# Patient Record
Sex: Female | Born: 1970 | Race: White | Hispanic: No | Marital: Married | Smoking: Never smoker
Health system: Southern US, Community
[De-identification: ages and names within clinical notes are randomized; demographics above are authoritative.]

## PROBLEM LIST (undated history)

## (undated) DIAGNOSIS — Z8639 Personal history of other endocrine, nutritional and metabolic disease: Secondary | ICD-10-CM

## (undated) DIAGNOSIS — E669 Obesity, unspecified: Secondary | ICD-10-CM

## (undated) DIAGNOSIS — J219 Acute bronchiolitis, unspecified: Secondary | ICD-10-CM

## (undated) DIAGNOSIS — I10 Essential (primary) hypertension: Secondary | ICD-10-CM

## (undated) HISTORY — DX: Acute bronchiolitis, unspecified: J21.9

## (undated) HISTORY — PX: AUGMENTATION MAMMAPLASTY: SUR837

## (undated) HISTORY — PX: COMBINED AUGMENTATION MAMMAPLASTY AND ABDOMINOPLASTY: SUR291

## (undated) HISTORY — DX: Obesity, unspecified: E66.9

## (undated) HISTORY — DX: Essential (primary) hypertension: I10

## (undated) HISTORY — DX: Personal history of other endocrine, nutritional and metabolic disease: Z86.39

---

## 2011-08-21 DIAGNOSIS — Z8639 Personal history of other endocrine, nutritional and metabolic disease: Secondary | ICD-10-CM

## 2011-08-21 HISTORY — DX: Personal history of other endocrine, nutritional and metabolic disease: Z86.39

## 2016-11-19 ENCOUNTER — Encounter: Payer: Self-pay | Admitting: Interventional Cardiology

## 2016-12-04 ENCOUNTER — Encounter (INDEPENDENT_AMBULATORY_CARE_PROVIDER_SITE_OTHER): Payer: Self-pay

## 2016-12-04 ENCOUNTER — Ambulatory Visit (INDEPENDENT_AMBULATORY_CARE_PROVIDER_SITE_OTHER): Payer: 59 | Admitting: Interventional Cardiology

## 2016-12-04 ENCOUNTER — Encounter: Payer: Self-pay | Admitting: Interventional Cardiology

## 2016-12-04 VITALS — BP 120/90 | HR 100 | Ht 63.75 in | Wt 201.0 lb

## 2016-12-04 DIAGNOSIS — M7989 Other specified soft tissue disorders: Secondary | ICD-10-CM

## 2016-12-04 DIAGNOSIS — I10 Essential (primary) hypertension: Secondary | ICD-10-CM | POA: Diagnosis not present

## 2016-12-04 DIAGNOSIS — R002 Palpitations: Secondary | ICD-10-CM

## 2016-12-04 MED ORDER — LOSARTAN POTASSIUM 25 MG PO TABS: 25.0000 mg | ORAL_TABLET | Freq: Every day | ORAL | 3 refills | Status: DC

## 2016-12-04 MED ORDER — LOSARTAN POTASSIUM 25 MG PO TABS: 25.0000 mg | ORAL_TABLET | Freq: Every day | ORAL | 3 refills | Status: AC

## 2016-12-04 MED ORDER — HYDROCHLOROTHIAZIDE 12.5 MG PO CAPS: 12.5000 mg | ORAL_CAPSULE | Freq: Every day | ORAL | 3 refills | Status: DC

## 2016-12-04 NOTE — Progress Notes (Signed)
Cardiology Office Note   Date:  12/04/2016   ID:  Angela Kemp, DOB 03-May-1971, MRN [id number]  PCP:  WHITE, MARSHA L, NP    No chief complaint on file. HTN   Wt Readings from Last 3 Encounters:  12/04/16 201 lb (91.2 kg)       History of Present Illness: Angela Kemp is a 46 y.o. female who is being seen today for the evaluation of hypertension, palpitations at the request of Kathlen Brunswick, NP.  She has been on meds for a year and her BP is controlled.  She has a cough since starting this medicine.  It is a dry annoying cough.  She has had some palpitations over the past 6 months.  She thinks it is anxiety.  It may be in the middle of the night.  Resolves with a few deep breaths.  Nothing more sustained.  No lightheadedness with that.  She has these episodes 2-3x/week.  It can start during the day as well.    Her father has AFib.  Her Mom has mitral valve prolapse.    She had a heart murmur as a child, but no echo was done that she remembers.    She walks 3x/week.  Occasional DOE.  Up until 3 months ago, she was running without any SHOB. She is planning on going back to running for exercise.  No chest pain  She is originally from [place].    She has been taking phentermine for weight loss.      Past Medical History:  Diagnosis Date  . Acute bronchiolitis   . History of early menarche 2013  . Hypertension   . Obesity     Past Surgical History:  Procedure Laterality Date  . COMBINED AUGMENTATION MAMMAPLASTY AND ABDOMINOPLASTY       Current Outpatient Prescriptions  Medication Sig Dispense Refill  . lisinopril-hydrochlorothiazide (PRINZIDE,ZESTORETIC) 10-12.5 MG tablet Take 1 tablet by mouth 2 (two) times daily.    . phentermine 15 MG capsule Take 15 mg by mouth daily.  0   No current facility-administered medications for this visit.     Allergies:   Penicillins    Social History:  The patient  reports that she has never smoked. She has never used  smokeless tobacco. She reports that she drinks alcohol. She reports that she does not use drugs.   Family History:  The patient's family history includes Breast cancer in her mother; Hypertension in her father.    ROS:  Please see the history of present illness.   Otherwise, review of systems are positive for palpitations.   All other systems are reviewed and negative.    PHYSICAL EXAM: VS:  BP 120/90   Pulse 100   Ht 5' 3.75" (1.619 m)   Wt 201 lb (91.2 kg)   BMI 34.77 kg/m  , BMI Body mass index is 34.77 kg/m. GEN: Well nourished, well developed, in no acute distress  HEENT: normal  Neck: no JVD, carotid bruits, or masses Cardiac: RRR; no murmurs, rubs, or gallops,no edema  Respiratory:  clear to auscultation bilaterally, normal work of breathing GI: soft, nontender, nondistended, + BS MS: no deformity or atrophy  Skin: warm and dry, no rash Neuro:  Strength and sensation are intact Psych: euthymic mood, full affect   EKG:   The ekg ordered 09/25/16 demonstrates NSR, no significant ST changes   Recent Labs: No results found for requested labs within last 8760 hours.   Lipid Panel No results  found for: CHOL, TRIG, HDL, CHOLHDL, VLDL, LDLCALC, LDLDIRECT   Other studies Reviewed: Additional studies/ records that were reviewed today with results demonstrating: prior ECG reviewed.   ASSESSMENT AND PLAN:  1. Palpitations: No high risk features. No lightheadedness or syncope. Normal physical exam. We'll plan for 30 day event monitor.  2. She reports left lower extremity swelling, worse than her right leg. She states there is a hard area in the upper portion of her calf at times. We'll plan for lower extremity venous Doppler to evaluate for DVT. Today on exam, there is no significant difference in size between her calves. 3. Hypertension: Due to her cough, we'll switch her lisinopril to losartan 25 mg daily. Continue HCTZ 12.5 mg daily. There is no combination pill in the  strength so we will call in 2 separate prescriptions. Cough should improve off of the ACE inhibitor.   Current medicines are reviewed at length with the patient today.  The patient concerns regarding her medicines were addressed.  The following changes have been made:  HTN med change  Labs/ tests ordered today include:  No orders of the defined types were placed in this encounter.   Recommend 150 minutes/week of aerobic exercise Low fat, low carb, high fiber diet recommended  Disposition:   FU in prn   Signed, Lance Muss, MD  12/04/2016 5:07 PM    [facility] [address] [place], [place], [place]  [address] Phone: [phone]; Fax: [phone]

## 2016-12-04 NOTE — Patient Instructions (Signed)
Medication Instructions:  STOP lisinopril-HCTZ  START losartan 25 mg daily  START HCTZ 12.5 mg daily  Labwork: None ordered  Testing/Procedures: Your physician has recommended that you wear an event monitor. Event monitors are medical devices that record the heart's electrical activity. Doctors most often [place] these monitors to diagnose arrhythmias. Arrhythmias are problems with the speed or rhythm of the heartbeat. The monitor is a small, portable device. You can wear one while you do your normal daily activities. This is usually used to diagnose what is causing palpitations/syncope (passing out).  Your physician has requested that you have a lower extremity venous duplex. This test is an ultrasound of the veins in the legs or arms. It looks at venous blood flow that carries blood from the heart to the legs or arms. Allow one hour for a Lower Venous exam. Allow thirty minutes for an Upper Venous exam. There are no restrictions or special instructions.     Follow-Up: To be determined based on results  Any Other Special Instructions Will Be Listed Below (If Applicable).     If you need a refill on your cardiac medications before your next appointment, please call your pharmacy.

## 2016-12-19 ENCOUNTER — Telehealth: Payer: Self-pay | Admitting: *Deleted

## 2016-12-19 MED ORDER — HYDROCHLOROTHIAZIDE 12.5 MG PO CAPS: 12.5000 mg | ORAL_CAPSULE | Freq: Two times a day (BID) | ORAL | 3 refills | Status: AC

## 2016-12-19 NOTE — Telephone Encounter (Signed)
OK to increase HCTZ to 25 mg daily and see if swelling gets better.

## 2016-12-19 NOTE — Telephone Encounter (Signed)
Called and made patient aware of Dr. Hoyle Barr recommendations. Patient requesting another Rx be sent to [facility] so that she does not run out. Prescription sent to patient's preferred pharmacy. Patient verbalized understanding and thanked me for the call.

## 2016-12-19 NOTE — Telephone Encounter (Signed)
Called and spoke with patient. Patient states that since she switched her medicine at her OV on 4/17 that she has had increased swelling in her hands and feet. Patient denies any swelling in her face and neck, SOB, weight gain, or any other symptoms. Patient was taking lisinopril-HCTZ 10-12.5 BID and was switched to take losartan 25 mg QD and HCTZ 12.5 QD. Patient states that her cough has improved since switching, but she would rather go back to the lisinopril-HCTZ if needed. Please advise.

## 2016-12-19 NOTE — Telephone Encounter (Signed)
Patient called with c/o swelling in her feet and hands. She feels that this is due to the recent change in meds at her last office visit. She states that the cough has subsided. Patient can be reached at [phone]. Please advise. Thanks, MI

## 2016-12-24 ENCOUNTER — Ambulatory Visit (HOSPITAL_COMMUNITY)
Admission: RE | Admit: 2016-12-24 | Discharge: 2016-12-24 | Disposition: A | Discharge status: Home / Self Care | Payer: 59 | Source: Ambulatory Visit | Attending: Cardiovascular Disease | Admitting: Cardiovascular Disease

## 2016-12-24 ENCOUNTER — Ambulatory Visit (INDEPENDENT_AMBULATORY_CARE_PROVIDER_SITE_OTHER): Payer: 59

## 2016-12-24 DIAGNOSIS — M7989 Other specified soft tissue disorders: Secondary | ICD-10-CM | POA: Insufficient documentation

## 2016-12-24 DIAGNOSIS — R002 Palpitations: Secondary | ICD-10-CM

## 2017-12-04 DIAGNOSIS — S61216A Laceration without foreign body of right little finger without damage to nail, initial encounter: Secondary | ICD-10-CM | POA: Diagnosis not present

## 2017-12-04 DIAGNOSIS — W540XXA Bitten by dog, initial encounter: Secondary | ICD-10-CM | POA: Diagnosis not present

## 2017-12-05 DIAGNOSIS — Y939 Activity, unspecified: Secondary | ICD-10-CM | POA: Diagnosis not present

## 2017-12-05 DIAGNOSIS — L03113 Cellulitis of right upper limb: Secondary | ICD-10-CM | POA: Diagnosis not present

## 2017-12-05 DIAGNOSIS — I10 Essential (primary) hypertension: Secondary | ICD-10-CM | POA: Diagnosis not present

## 2017-12-05 DIAGNOSIS — Y999 Unspecified external cause status: Secondary | ICD-10-CM | POA: Diagnosis not present

## 2017-12-05 DIAGNOSIS — S61451A Open bite of right hand, initial encounter: Secondary | ICD-10-CM | POA: Diagnosis not present

## 2017-12-05 DIAGNOSIS — W540XXA Bitten by dog, initial encounter: Secondary | ICD-10-CM | POA: Diagnosis not present

## 2017-12-05 DIAGNOSIS — Y929 Unspecified place or not applicable: Secondary | ICD-10-CM | POA: Diagnosis not present

## 2017-12-19 DIAGNOSIS — I10 Essential (primary) hypertension: Secondary | ICD-10-CM | POA: Diagnosis not present

## 2017-12-19 DIAGNOSIS — Z7689 Persons encountering health services in other specified circumstances: Secondary | ICD-10-CM | POA: Diagnosis not present

## 2017-12-19 DIAGNOSIS — Z6836 Body mass index (BMI) 36.0-36.9, adult: Secondary | ICD-10-CM | POA: Diagnosis not present

## 2017-12-19 DIAGNOSIS — Z713 Dietary counseling and surveillance: Secondary | ICD-10-CM | POA: Diagnosis not present

## 2018-02-18 DIAGNOSIS — E669 Obesity, unspecified: Secondary | ICD-10-CM | POA: Diagnosis not present

## 2018-02-18 DIAGNOSIS — I10 Essential (primary) hypertension: Secondary | ICD-10-CM | POA: Diagnosis not present

## 2018-02-18 DIAGNOSIS — Z713 Dietary counseling and surveillance: Secondary | ICD-10-CM | POA: Diagnosis not present

## 2018-04-22 DIAGNOSIS — I10 Essential (primary) hypertension: Secondary | ICD-10-CM | POA: Diagnosis not present

## 2018-04-22 DIAGNOSIS — E669 Obesity, unspecified: Secondary | ICD-10-CM | POA: Diagnosis not present

## 2018-04-22 DIAGNOSIS — H9202 Otalgia, left ear: Secondary | ICD-10-CM | POA: Diagnosis not present

## 2018-04-22 DIAGNOSIS — Z713 Dietary counseling and surveillance: Secondary | ICD-10-CM | POA: Diagnosis not present

## 2018-07-22 DIAGNOSIS — Z7689 Persons encountering health services in other specified circumstances: Secondary | ICD-10-CM | POA: Diagnosis not present

## 2018-07-22 DIAGNOSIS — Z713 Dietary counseling and surveillance: Secondary | ICD-10-CM | POA: Diagnosis not present

## 2018-07-22 DIAGNOSIS — Z6832 Body mass index (BMI) 32.0-32.9, adult: Secondary | ICD-10-CM | POA: Diagnosis not present

## 2018-10-21 DIAGNOSIS — Z713 Dietary counseling and surveillance: Secondary | ICD-10-CM | POA: Diagnosis not present

## 2018-10-21 DIAGNOSIS — Z7689 Persons encountering health services in other specified circumstances: Secondary | ICD-10-CM | POA: Diagnosis not present

## 2018-10-21 DIAGNOSIS — Z6832 Body mass index (BMI) 32.0-32.9, adult: Secondary | ICD-10-CM | POA: Diagnosis not present

## 2018-10-21 DIAGNOSIS — I10 Essential (primary) hypertension: Secondary | ICD-10-CM | POA: Diagnosis not present

## 2019-06-25 DIAGNOSIS — I10 Essential (primary) hypertension: Secondary | ICD-10-CM | POA: Diagnosis not present

## 2019-06-25 DIAGNOSIS — Z713 Dietary counseling and surveillance: Secondary | ICD-10-CM | POA: Diagnosis not present

## 2019-06-25 DIAGNOSIS — Z6836 Body mass index (BMI) 36.0-36.9, adult: Secondary | ICD-10-CM | POA: Diagnosis not present

## 2019-06-25 DIAGNOSIS — E669 Obesity, unspecified: Secondary | ICD-10-CM | POA: Diagnosis not present

## 2019-07-09 DIAGNOSIS — J0110 Acute frontal sinusitis, unspecified: Secondary | ICD-10-CM | POA: Diagnosis not present

## 2019-07-22 DIAGNOSIS — R590 Localized enlarged lymph nodes: Secondary | ICD-10-CM | POA: Diagnosis not present

## 2019-07-22 DIAGNOSIS — L309 Dermatitis, unspecified: Secondary | ICD-10-CM | POA: Diagnosis not present

## 2019-09-25 DIAGNOSIS — Z713 Dietary counseling and surveillance: Secondary | ICD-10-CM | POA: Diagnosis not present

## 2019-09-25 DIAGNOSIS — Z7689 Persons encountering health services in other specified circumstances: Secondary | ICD-10-CM | POA: Diagnosis not present

## 2019-10-15 DIAGNOSIS — Z0000 Encounter for general adult medical examination without abnormal findings: Secondary | ICD-10-CM | POA: Diagnosis not present

## 2019-10-15 DIAGNOSIS — Z124 Encounter for screening for malignant neoplasm of cervix: Secondary | ICD-10-CM | POA: Diagnosis not present

## 2019-10-15 DIAGNOSIS — Z131 Encounter for screening for diabetes mellitus: Secondary | ICD-10-CM | POA: Diagnosis not present

## 2019-10-15 DIAGNOSIS — Z13220 Encounter for screening for lipoid disorders: Secondary | ICD-10-CM | POA: Diagnosis not present

## 2020-01-22 DIAGNOSIS — Z7689 Persons encountering health services in other specified circumstances: Secondary | ICD-10-CM | POA: Diagnosis not present

## 2020-01-22 DIAGNOSIS — T63441A Toxic effect of venom of bees, accidental (unintentional), initial encounter: Secondary | ICD-10-CM | POA: Diagnosis not present

## 2020-08-03 DIAGNOSIS — Z20822 Contact with and (suspected) exposure to covid-19: Secondary | ICD-10-CM | POA: Diagnosis not present

## 2020-08-03 DIAGNOSIS — Z03818 Encounter for observation for suspected exposure to other biological agents ruled out: Secondary | ICD-10-CM | POA: Diagnosis not present

## 2020-08-25 ENCOUNTER — Other Ambulatory Visit: Payer: Self-pay | Admitting: Nurse Practitioner

## 2020-08-25 DIAGNOSIS — Z6836 Body mass index (BMI) 36.0-36.9, adult: Secondary | ICD-10-CM | POA: Diagnosis not present

## 2020-08-25 DIAGNOSIS — Z7689 Persons encountering health services in other specified circumstances: Secondary | ICD-10-CM | POA: Diagnosis not present

## 2020-08-25 DIAGNOSIS — Z1231 Encounter for screening mammogram for malignant neoplasm of breast: Secondary | ICD-10-CM

## 2020-08-25 DIAGNOSIS — I10 Essential (primary) hypertension: Secondary | ICD-10-CM | POA: Diagnosis not present

## 2020-09-09 ENCOUNTER — Other Ambulatory Visit: Payer: Self-pay

## 2020-09-09 ENCOUNTER — Ambulatory Visit
Admission: RE | Admit: 2020-09-09 | Discharge: 2020-09-09 | Disposition: A | Discharge status: Home / Self Care | Payer: PRIVATE HEALTH INSURANCE | Source: Ambulatory Visit | Attending: Nurse Practitioner | Admitting: Nurse Practitioner

## 2020-09-09 DIAGNOSIS — Z1231 Encounter for screening mammogram for malignant neoplasm of breast: Secondary | ICD-10-CM

## 2020-09-13 DIAGNOSIS — J069 Acute upper respiratory infection, unspecified: Secondary | ICD-10-CM | POA: Diagnosis not present

## 2021-12-30 IMAGING — MG MM DIGITAL SCREENING BILAT W/ TOMO AND CAD
8 series · 8 of 24 positions shown · non-contrast
Comparison: None.

CLINICAL DATA: Screening.

EXAM:
DIGITAL SCREENING BILATERAL MAMMOGRAM WITH TOMOSYNTHESIS AND CAD

[L CC synth-2D]
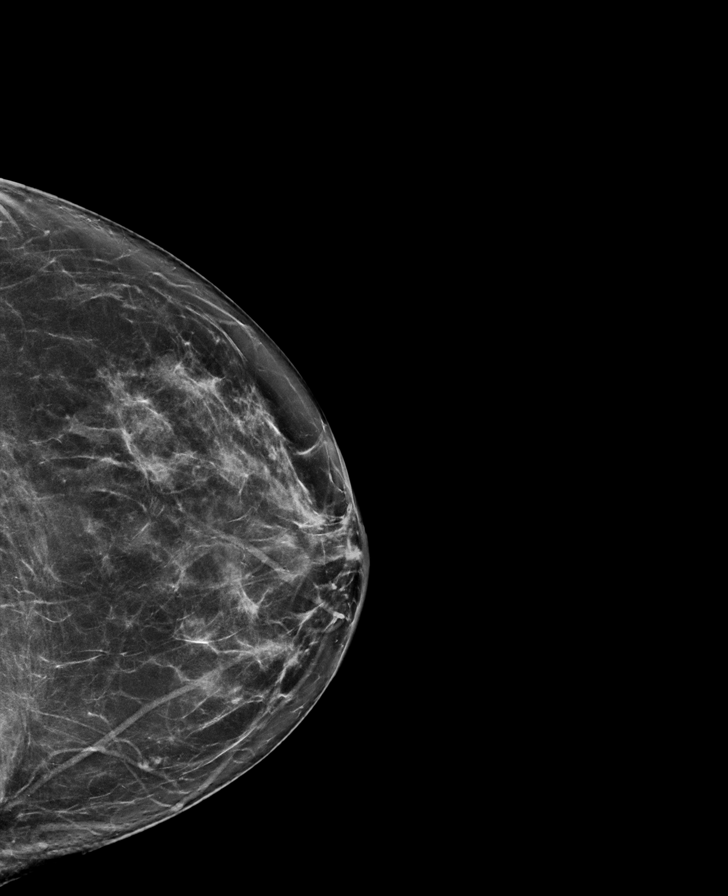

[R CC synth-2D]
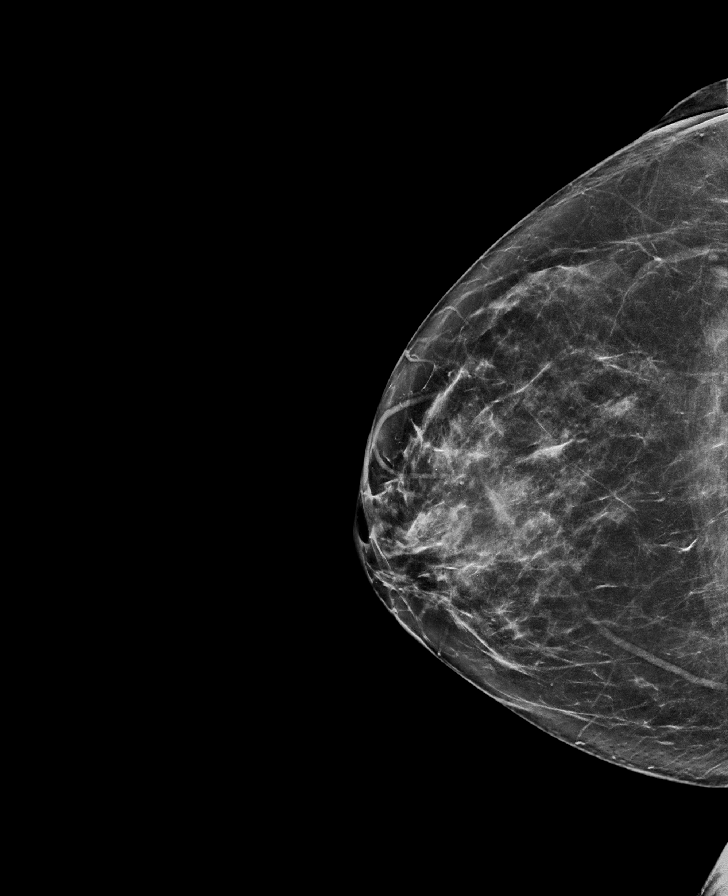

[R MLO synth-2D]
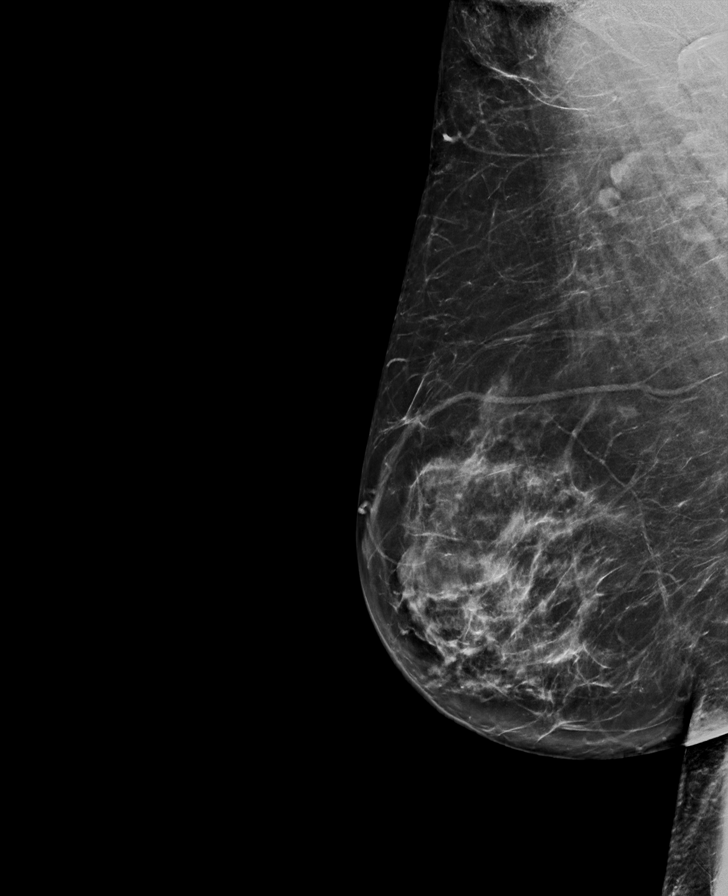

[L MLO synth-2D]
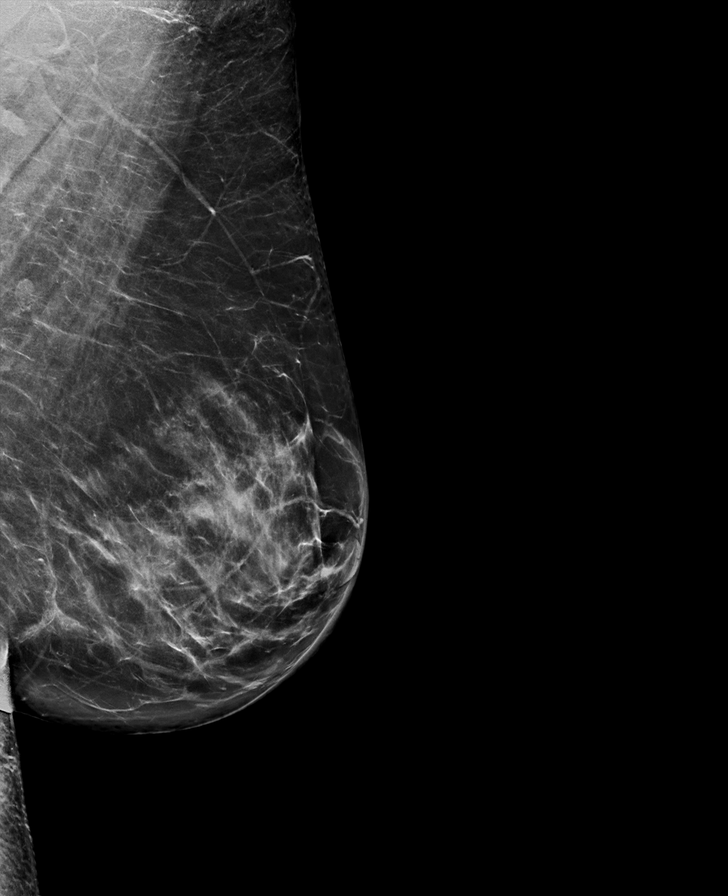

[R MLO tomo · tomo slice 41/80.0]
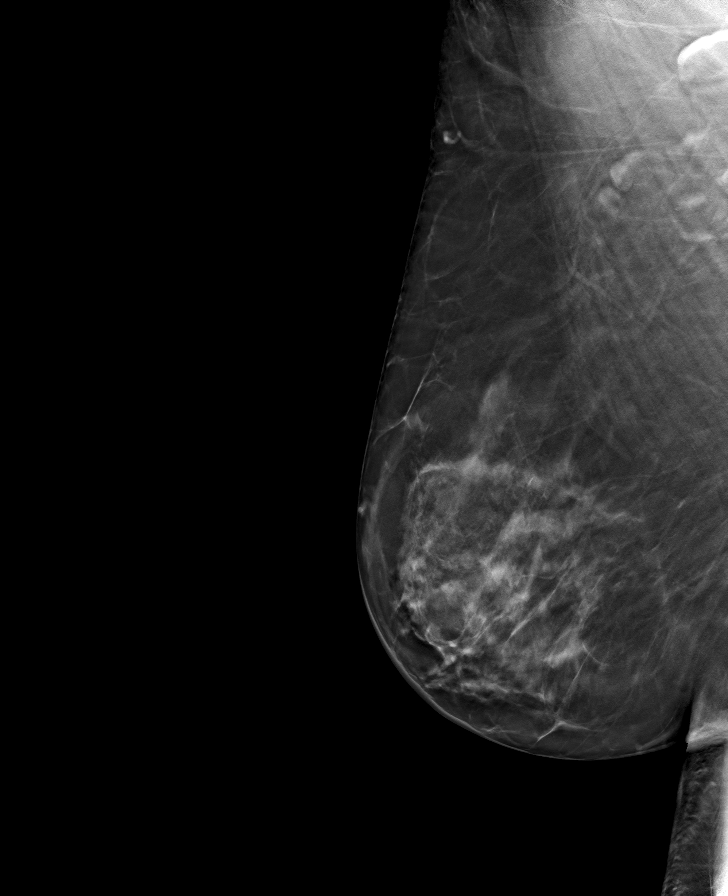

[L MLO tomo · tomo slice 41/81.0]
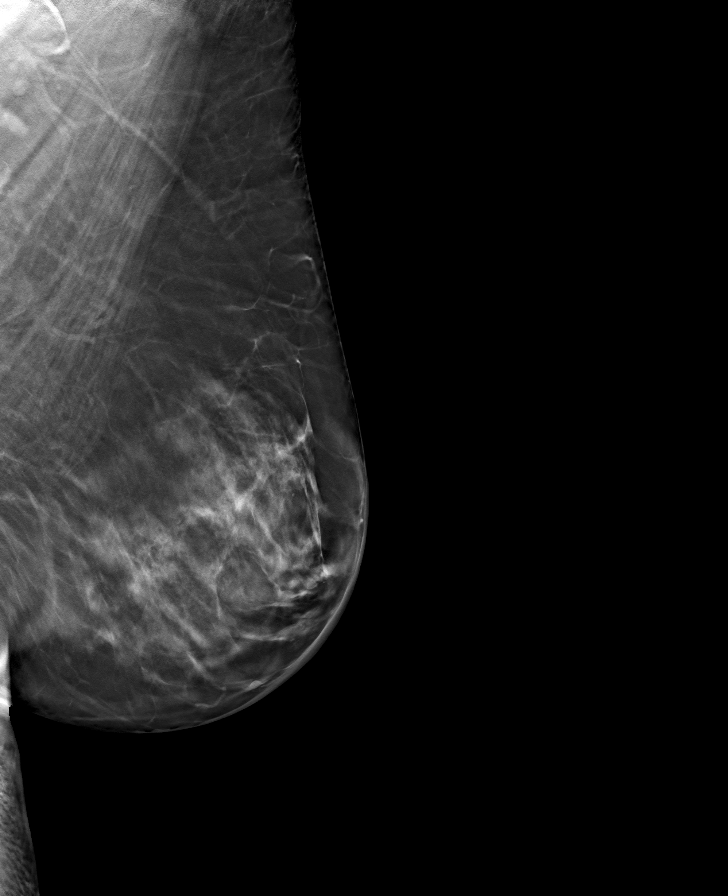

[R CC tomo · tomo slice 37/74.0]
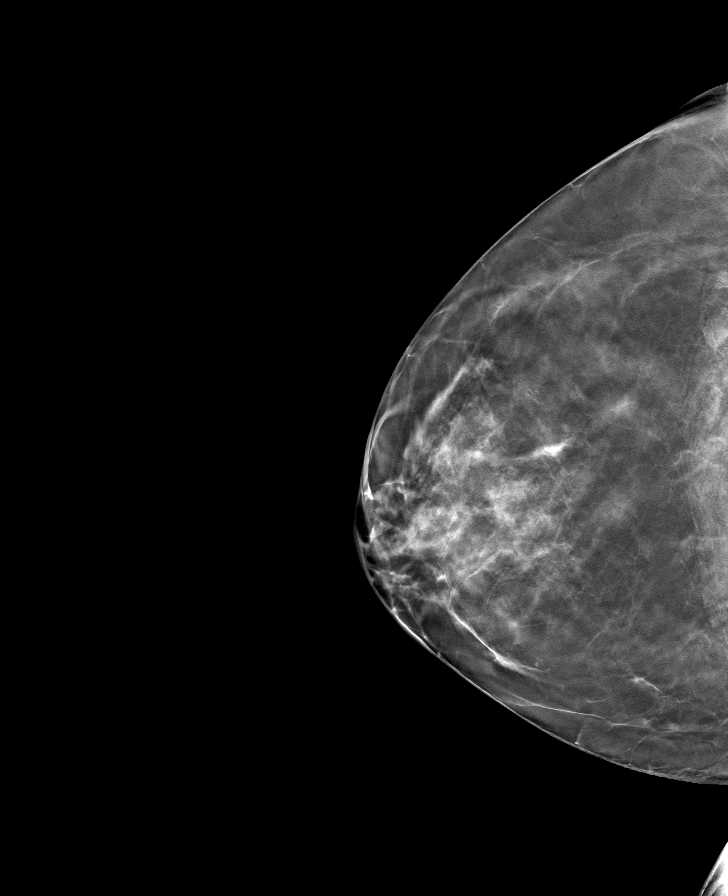

[L CC tomo · tomo slice 37/72.0]
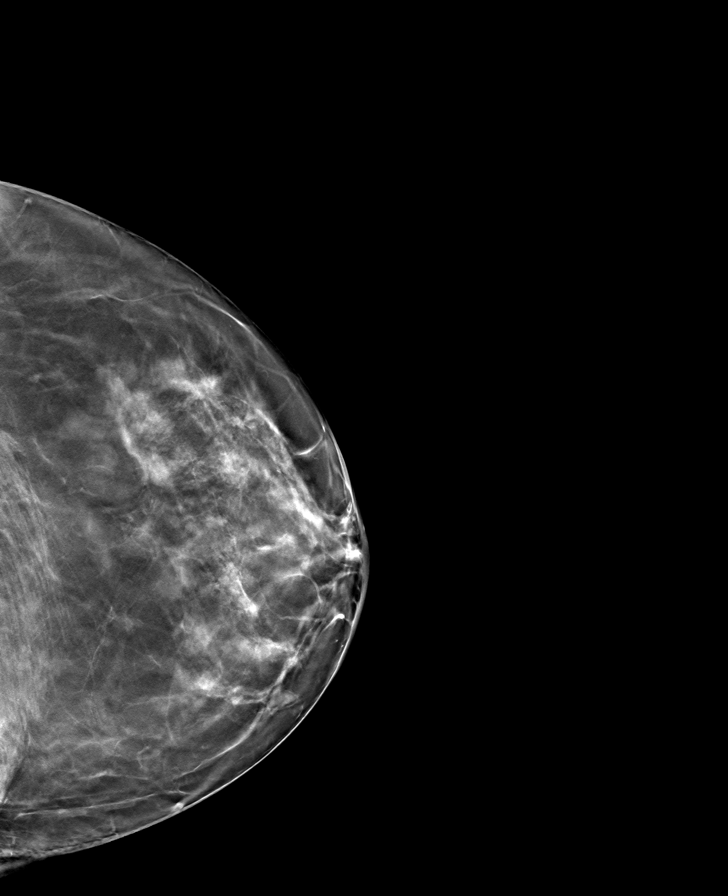

[8 of 24 positions shown; findings below may reference images not displayed]

ACR Breast Density Category c: The breast tissue is heterogeneously
dense, which may obscure small masses
FINDINGS: There are no findings suspicious for malignancy.
IMPRESSION: No mammographic evidence of malignancy. A result letter of this
screening mammogram will be mailed directly to the patient.

RECOMMENDATION:
Screening mammogram in one year. (Code:OF-K-PLN)

BI-RADS CATEGORY  1: Negative.

## 2023-04-03 ENCOUNTER — Other Ambulatory Visit: Payer: Self-pay | Admitting: Family Medicine

## 2023-04-03 DIAGNOSIS — Z1231 Encounter for screening mammogram for malignant neoplasm of breast: Secondary | ICD-10-CM

## 2023-05-21 ENCOUNTER — Other Ambulatory Visit: Payer: Self-pay | Admitting: Family Medicine

## 2023-05-21 ENCOUNTER — Ambulatory Visit
Admission: RE | Admit: 2023-05-21 | Discharge: 2023-05-21 | Disposition: A | Discharge status: Home / Self Care | Payer: PRIVATE HEALTH INSURANCE | Source: Ambulatory Visit | Attending: Family Medicine | Admitting: Family Medicine

## 2023-05-21 DIAGNOSIS — Z1231 Encounter for screening mammogram for malignant neoplasm of breast: Secondary | ICD-10-CM
